# Patient Record
Sex: Male | Born: 1980 | Race: White | Hispanic: No | Marital: Single | Smoking: Current every day smoker
Health system: Southern US, Community
[De-identification: ages and names within clinical notes are randomized; demographics above are authoritative.]

## PROBLEM LIST (undated history)

## (undated) DIAGNOSIS — F329 Major depressive disorder, single episode, unspecified: Secondary | ICD-10-CM

## (undated) DIAGNOSIS — F32A Depression, unspecified: Secondary | ICD-10-CM

## (undated) DIAGNOSIS — T7840XA Allergy, unspecified, initial encounter: Secondary | ICD-10-CM

## (undated) HISTORY — DX: Allergy, unspecified, initial encounter: T78.40XA

## (undated) HISTORY — PX: SPINE SURGERY: SHX786

---

## 2013-07-15 ENCOUNTER — Ambulatory Visit: Payer: 59

## 2013-07-15 ENCOUNTER — Ambulatory Visit: Payer: 59 | Admitting: Emergency Medicine

## 2013-07-15 VITALS — BP 116/78 | HR 68 | Temp 98.1°F | Resp 14 | Ht 69.0 in | Wt 232.8 lb

## 2013-07-15 DIAGNOSIS — S4991XA Unspecified injury of right shoulder and upper arm, initial encounter: Secondary | ICD-10-CM

## 2013-07-15 DIAGNOSIS — S4980XA Other specified injuries of shoulder and upper arm, unspecified arm, initial encounter: Secondary | ICD-10-CM

## 2013-07-15 DIAGNOSIS — S43109A Unspecified dislocation of unspecified acromioclavicular joint, initial encounter: Secondary | ICD-10-CM

## 2013-07-15 DIAGNOSIS — S46909A Unspecified injury of unspecified muscle, fascia and tendon at shoulder and upper arm level, unspecified arm, initial encounter: Secondary | ICD-10-CM

## 2013-07-15 DIAGNOSIS — M25511 Pain in right shoulder: Secondary | ICD-10-CM

## 2013-07-15 MED ORDER — HYDROCODONE-ACETAMINOPHEN 10-325 MG PO TABS: 1.0000 | ORAL_TABLET | Freq: Two times a day (BID) | ORAL | Status: DC | PRN

## 2013-07-15 MED ORDER — MELOXICAM 15 MG PO TABS: 15.0000 mg | ORAL_TABLET | Freq: Every day | ORAL | Status: DC

## 2013-07-15 NOTE — Progress Notes (Signed)
   Subjective:    Patient ID: Fernando Luna, male    DOB: 01/03/1981, 33 y.o.   MRN: [id number]  HPI Patient presents today with pain in right shoulder. He fell on it yesterday while playing softball in [place]. Patient is right handed. Painful with impact. Has taken ibuprofen 800 mg alternating with acetaminophen 1000 mg. Little relief, unable to sleep due to pain. Also has abrasions on right knee and thigh and left flank area from sliding on field. He is not sure if he hit his head, his hat was dirty after he fell, but his head did not hurt, there was no loss of consciousness.  PMH- seasonal allergies  PSH- herniated disk surgery 2011- this [facility]. Has numbness when sitting for long times. Does not require medication for pain.   Review of Systems No headache, no radiation of pain, no weakness in hand, no numbness or tingling.    Objective:   Physical Exam  Vitals reviewed. Constitutional: He is oriented to person, place, and time. He appears well-developed and well-nourished. No distress.  HENT:  Head: Normocephalic and atraumatic.  Eyes: Conjunctivae are normal. Right eye exhibits no discharge. Left eye exhibits no discharge.  Neck: Normal range of motion. Neck supple.  Cardiovascular: Normal rate, regular rhythm and normal heart sounds.   Pulmonary/Chest: Effort normal and breath sounds normal.  Musculoskeletal: He exhibits tenderness. He exhibits no edema.       Right shoulder: He exhibits decreased range of motion, tenderness and pain.  Right hand, wrist, elbow with normal range of motion. Strong radial pulse, brisk capillary refill. Unable to raise right arm more than 90 degrees. Painful over anterior shoulder and lateral clavicle. No abnormalities palpated.   Neurological: He is alert and oriented to person, place, and time.  Skin: Skin is warm and dry. He is not diaphoretic.  Left knee, shin, right lateral side with abrasions.  Psychiatric: He has a normal mood  and affect. His behavior is normal. Judgment and thought content normal.   Right shoulder Xray and Right clavicle Xray- [facility] reading [facility]PRIMARY[facility] AC joint separation grade 1-2.     Assessment & Plan:  1. Acromioclavicular joint separation - Ambulatory referral to Orthopedic Surgery -Right shoulder sling applied, patient instructions on AC separation provided  2. Right shoulder injury - DG Clavicle Right; Future - DG Shoulder Right; Future - meloxicam (MOBIC) 15 MG tablet; Take 1 tablet (15 mg total) by mouth daily.  Dispense: 30 tablet; Refill: 0  3. Right shoulder pain - meloxicam (MOBIC) 15 MG tablet; Take 1 tablet (15 mg total) by mouth daily.  Dispense: 30 tablet; Refill: 0 - HYDROcodone-acetaminophen (NORCO) 10-325 MG per tablet; Take 1 tablet by mouth every 12 (twelve) hours as needed.  Dispense: 20 tablet; Refill: 0  Discussed with Dr. Cleta Albertsaub who read the xray and examined the patient.  Emi Belfasteborah B. Gessner, FNP-BC  [facility]  07/15/2013 11:19 AM

## 2013-07-15 NOTE — Patient Instructions (Signed)
Shoulder Separation You have a shoulder separation (acromio-clavicular separation). This occurs when an injury stretches or tears the ligaments that connect the top of the shoulder blade to the collar bone. Injury causes these bones to separate. A sling or splint may be applied to limit your range of motion. HOME CARE INSTRUCTIONS   Apply ice to the injury for 15-20 minutes each hour while awake for the first 2 days. Put the ice in a plastic bag. Place a towel between the bag of ice and your skin.  Avoid activities that increase the pain.  Wear your sling or splint for as long as directed by your caregiver. If you remove the sling to dress or bathe, be sure your arm remains in the same position as when the sling is on. Do not lift your arm.  The splint must be tightened by another person every day. Tighten it enough to keep the shoulders held back. Allow enough room to place the index finger between the body and strap. Loosen the splint immediately if you feel numbness or tingling in your hands.  Only take over-the-counter or prescription medicines for pain, discomfort, or fever as directed by your caregiver.  If this is a severe injury, you may need a referral to a specialist. It is very important to keep any follow-up appointments in order to avoid any type of permanent shoulder disability or chronic pain problems. SEEK MEDICAL CARE IF:  Pain or swelling to the injury increases and is not relieved with medications. SEEK IMMEDIATE MEDICAL CARE IF:  Your arm becomes numb, pale, cold, or there are abnormal feelings (sensations) shooting into your fingertips. Document Released: 12/15/2004 Document Revised: 05/30/2011 Document Reviewed: 10/17/2006 ExitCare Patient Information 2014 [employer].  

## 2013-07-16 ENCOUNTER — Telehealth: Payer: Self-pay

## 2013-07-16 NOTE — Telephone Encounter (Signed)
Patient stated that he is taking hydrocodone with food, however, it makes him feel like he is going to pass out when he takes it.  He states that he has taken the medication before with the same side effect.  Would like to have a different medicine sent to pharmacy if possible.  Please advise.

## 2013-07-16 NOTE — Telephone Encounter (Signed)
He needs to stop the hydrocodone. He can only take the ibuprofen and Tylenol together no other narcotics

## 2013-07-16 NOTE — Telephone Encounter (Signed)
Spoke with patient.  Advised him to stop hydrocodone all together.  Take only ibuprofen and tylenol together.  Patient verbalized understanding.

## 2013-07-16 NOTE — Telephone Encounter (Signed)
PT STATES THE MEDICINE HE WAS GIVEN YESTERDAY IS MAKING HIM SICK, WOULD LIKE A CALL BACK AT [phone]

## 2015-03-03 ENCOUNTER — Emergency Department (HOSPITAL_COMMUNITY)
Admission: EM | Admit: 2015-03-03 | Discharge: 2015-03-03 | Disposition: A | Discharge status: Other Acute Inpatient Hospital | Payer: 59 | Attending: Emergency Medicine | Admitting: Emergency Medicine

## 2015-03-03 ENCOUNTER — Encounter (HOSPITAL_COMMUNITY): Payer: Self-pay | Admitting: Nurse Practitioner

## 2015-03-03 DIAGNOSIS — F17210 Nicotine dependence, cigarettes, uncomplicated: Secondary | ICD-10-CM | POA: Insufficient documentation

## 2015-03-03 DIAGNOSIS — Z79899 Other long term (current) drug therapy: Secondary | ICD-10-CM | POA: Diagnosis not present

## 2015-03-03 DIAGNOSIS — F329 Major depressive disorder, single episode, unspecified: Secondary | ICD-10-CM | POA: Insufficient documentation

## 2015-03-03 DIAGNOSIS — R45851 Suicidal ideations: Secondary | ICD-10-CM | POA: Diagnosis present

## 2015-03-03 DIAGNOSIS — F419 Anxiety disorder, unspecified: Secondary | ICD-10-CM | POA: Diagnosis not present

## 2015-03-03 DIAGNOSIS — Z791 Long term (current) use of non-steroidal anti-inflammatories (NSAID): Secondary | ICD-10-CM | POA: Insufficient documentation

## 2015-03-03 HISTORY — DX: Depression, unspecified: F32.A

## 2015-03-03 HISTORY — DX: Major depressive disorder, single episode, unspecified: F32.9

## 2015-03-03 LAB — COMPREHENSIVE METABOLIC PANEL
ALBUMIN: 4.3 g/dL (ref 3.5–5.0)
ALT: 80 U/L — ABNORMAL HIGH (ref 17–63)
AST: 41 U/L (ref 15–41)
Alkaline Phosphatase: 59 U/L (ref 38–126)
Anion gap: 9 (ref 5–15)
BUN: 7 mg/dL (ref 6–20)
CHLORIDE: 105 mmol/L (ref 101–111)
CO2: 26 mmol/L (ref 22–32)
Calcium: 9.5 mg/dL (ref 8.9–10.3)
Creatinine, Ser: 1.18 mg/dL (ref 0.61–1.24)
GFR calc Af Amer: >60 mL/min (ref >60)
Glucose, Bld: 89 mg/dL (ref 65–99)
POTASSIUM: 3.7 mmol/L (ref 3.5–5.1)
SODIUM: 140 mmol/L (ref 135–145)
Total Bilirubin: 0.8 mg/dL (ref 0.3–1.2)
Total Protein: 8.2 g/dL — ABNORMAL HIGH (ref 6.5–8.1)

## 2015-03-03 LAB — CBC
HCT: 48.8 % (ref 39.0–52.0)
HEMOGLOBIN: 16.7 g/dL (ref 13.0–17.0)
MCH: 31 pg (ref 26.0–34.0)
MCHC: 34.2 g/dL (ref 30.0–36.0)
MCV: 90.7 fL (ref 78.0–100.0)
PLATELETS: 260 10*3/uL (ref 150–400)
RBC: 5.38 MIL/uL (ref 4.22–5.81)
RDW: 12.5 % (ref 11.5–15.5)
WBC: 7 10*3/uL (ref 4.0–10.5)

## 2015-03-03 LAB — ETHANOL

## 2015-03-03 LAB — RAPID URINE DRUG SCREEN, HOSP PERFORMED
AMPHETAMINES: NOT DETECTED
BENZODIAZEPINES: NOT DETECTED
Barbiturates: NOT DETECTED
Cocaine: NOT DETECTED
OPIATES: NOT DETECTED
TETRAHYDROCANNABINOL: NOT DETECTED

## 2015-03-03 LAB — SALICYLATE LEVEL: Salicylate Lvl: <4 mg/dL (ref 2.8–30.0)

## 2015-03-03 LAB — ACETAMINOPHEN LEVEL: Acetaminophen (Tylenol), Serum: <10 ug/mL — ABNORMAL LOW (ref 10–30)

## 2015-03-03 MED ORDER — NICOTINE 21 MG/24HR TD PT24
21.0000 mg | MEDICATED_PATCH | Freq: Every day | TRANSDERMAL | Status: DC
  Administered 2015-03-03: 21 mg via TRANSDERMAL
  Filled 2015-03-03: qty 1

## 2015-03-03 MED ORDER — LORAZEPAM 1 MG PO TABS: 1.0000 mg | ORAL_TABLET | Freq: Three times a day (TID) | ORAL | Status: DC | PRN

## 2015-03-03 MED ORDER — ACETAMINOPHEN 325 MG PO TABS: 650.0000 mg | ORAL_TABLET | ORAL | Status: DC | PRN

## 2015-03-03 MED ORDER — ZOLPIDEM TARTRATE 5 MG PO TABS
5.0000 mg | ORAL_TABLET | Freq: Every evening | ORAL | Status: DC | PRN
Start: 2015-03-03 — End: 2015-03-04

## 2015-03-03 MED ORDER — IBUPROFEN 400 MG PO TABS: 600.0000 mg | ORAL_TABLET | Freq: Three times a day (TID) | ORAL | Status: DC | PRN

## 2015-03-03 MED ORDER — ALUM & MAG HYDROXIDE-SIMETH 200-200-20 MG/5ML PO SUSP: 30.0000 mL | ORAL | Status: DC | PRN

## 2015-03-03 MED ORDER — ONDANSETRON HCL 4 MG PO TABS: 4.0000 mg | ORAL_TABLET | Freq: Three times a day (TID) | ORAL | Status: DC | PRN

## 2015-03-03 NOTE — ED Provider Notes (Signed)
CSN: [id number]     Arrival date & time 03/03/15  1429 History   First MD Initiated Contact with Patient 03/03/15 1535     Chief Complaint  Patient presents with  . Suicidal    HPI This patient with history of depression presents with ongoing despondency, suicidal thoughts. Patient has had episodes periodically for at least one year or Over the past day he has had increasing perseverates about suicide. He can describe a desire for painless death, fulfilling his desire to not be here anymore. He denies explicit homicidal ideation. He denies physical pain or complaints. He was previously on Zoloft, is not taking this medication, as he feels it does not work.  Past Medical History  Diagnosis Date  . Allergy   . Depression    Past Surgical History  Procedure Laterality Date  . Spine surgery      herniated disc in back L4,L5   Family History  Problem Relation Age of Onset  . Diabetes Paternal Grandfather   . Heart disease Paternal Grandfather    Social History  Substance Use Topics  . Smoking status: Current Every Day Smoker -- 0.70 packs/day    Types: Cigarettes  . Smokeless tobacco: None  . Alcohol Use: 1.0 oz/week    2 Standard drinks or equivalent per week    Review of Systems  Constitutional:       Per HPI, otherwise negative  HENT:       Per HPI, otherwise negative  Respiratory:       Per HPI, otherwise negative  Cardiovascular:       Per HPI, otherwise negative  Gastrointestinal: Negative for vomiting.  Endocrine:       Negative aside from HPI  Genitourinary:       Neg aside from HPI   Musculoskeletal:       Per HPI, otherwise negative  Skin:       Self-inked tattoo on L wrist  Neurological: Negative for syncope.  Psychiatric/Behavioral: Positive for suicidal ideas and dysphoric mood. Negative for self-injury. The patient is nervous/anxious.       Allergies  Codeine  Home Medications   Prior to Admission medications   Medication Sig Start Date  End Date Taking? Authorizing Provider  cetirizine (ZYRTEC) 10 MG tablet Take 10 mg by mouth daily.    Historical Provider, MD  HYDROcodone-acetaminophen (NORCO) 10-325 MG per tablet Take 1 tablet by mouth every 12 (twelve) hours as needed. 07/15/13   Emi Belfasteborah B Gessner, FNP  meloxicam (MOBIC) 15 MG tablet Take 1 tablet (15 mg total) by mouth daily. 07/15/13   Emi Belfasteborah B Gessner, FNP   BP 118/87 mmHg  Pulse 83  Temp(Src) 98.2 F (36.8 C) (Oral)  Resp 17  Ht 5\' 10"  (1.778 m)  Wt 220 lb 4.8 oz (99.927 kg)  BMI 31.61 kg/m2  SpO2 95% Physical Exam  Constitutional: He is oriented to person, place, and time. He appears well-developed. No distress.  HENT:  Head: Normocephalic and atraumatic.  Eyes: Conjunctivae and EOM are normal.  Cardiovascular: Normal rate and regular rhythm.   Pulmonary/Chest: Effort normal. No stridor. No respiratory distress.  Abdominal: He exhibits no distension.  Musculoskeletal: He exhibits no edema.  Neurological: He is alert and oriented to person, place, and time.  Skin: Skin is warm and dry.  Psychiatric: He exhibits a depressed mood. He expresses suicidal ideation. He expresses no homicidal ideation. He expresses suicidal plans.  Nursing note and vitals reviewed.   ED Course  Procedures (  including critical care time) Labs Review Labs Reviewed  COMPREHENSIVE METABOLIC PANEL - Abnormal; Notable for the following:    Total Protein 8.2 (*)    ALT 80 (*)    All other components within normal limits  ACETAMINOPHEN LEVEL - Abnormal; Notable for the following:    Acetaminophen (Tylenol), Serum <10 (*)    All other components within normal limits  ETHANOL  SALICYLATE LEVEL  CBC  URINE RAPID DRUG SCREEN, HOSP PERFORMED      MDM  Comfortable appearing young male presents with ongoing despondency, suicidal ideation. Patient has unremarkable slight lab abnormalities, but no substantial concerns. Patient is hemodynamically stable, medically cleared for  psychiatric evaluation.  Gerhard Munch, MD 03/03/15 [phone]

## 2015-03-03 NOTE — ED Notes (Signed)
Tech sitting with pt. 

## 2015-03-03 NOTE — BH Assessment (Signed)
Per Vernona RiegerLaura, patient meets criteria for OBS.  [facility].  AC will contact the nurse when the bed becomes available.  Writer informed the nurse Kriste Basque(Becky)

## 2015-03-03 NOTE — ED Notes (Signed)
Pt was wanded by Security 

## 2015-03-03 NOTE — ED Notes (Signed)
He reports hx depression and suicidal thoughts, was treated for some time with zoloft but he felt it didn't work so he stopped taking it. Over past days hes felt more depressed and having suicidal thoughts again. He also feels like harming people such as "punching someon in the face" but does not have feelings of killing someone else. He denies a specific plan for suicide. He reports occasional alcohol use and denies any other substance use. He A&ox4, resp e/u

## 2015-03-03 NOTE — BH Assessment (Addendum)
Tele Assessment Note   Patient is a 34 year old white male that reports passive SI without a plan.  Patient reports his boyfriend recently broke up with him and stated that he no longer wanted any contact with him.    Patient reports increased depression associated with this break up.  Patient reports that, "he no longer wants to be in the works".  Patient denies having a plan to harm himself.  Patient reports a prior suicide attempt in 2008 when he took sleeping pills in an attempt to kill himself.  Patient reports that he was [place] on [place] [facility] [place], but he felt it didn't work so he stopped taking it. Over past days he's felt more depressed and having suicidal thoughts again.   Patient denies H/Psychosis/Substance Abuse.  Patient denies prior psychiatric hospitalization.  Patient denies physical, sexual or emotional abuse.   Patient reports that he lives with his parents and he works at a call center.  Patient reports that he has been working at this call center for over two years.  Patient reports that he was in [employer] for four and a half years.    Diagnosis: Major Depressive Disorder   Past Medical History:  Past Medical History  Diagnosis Date  . Allergy   . Depression     Past Surgical History  Procedure Laterality Date  . Spine surgery      herniated disc in back L4,L5    Family History:  Family History  Problem Relation Age of Onset  . Diabetes Paternal Grandfather   . Heart disease Paternal Grandfather     Social History:  reports that he has been smoking Cigarettes.  He has been smoking about 0.70 packs per day. He does not have any smokeless tobacco history on file. He reports that he drinks about 1.0 oz of alcohol per week. He reports that he does not use illicit drugs.  Additional Social History:     CIWA: CIWA-Ar BP: 118/87 mmHg Pulse Rate: 83 COWS:    PATIENT STRENGTHS: (choose at least two) Communication skills Physical  Health Work skills  Allergies:  Allergies  Allergen Reactions  . Codeine Nausea And Vomiting    Home Medications:  (Not in a hospital admission)  OB/GYN Status:  No LMP for male patient.  General Assessment Data [facility] Assessment: In system Is this a Tele or Face-to-Face Assessment?: Tele Assessment Is this an Initial Assessment or a Re-assessment for this encounter?: Initial Assessment Marital status: Single Maiden name: NA Is patient pregnant?: No Pregnancy Status: No Living Arrangements: Parent Can pt return to current living arrangement?: Yes Admission Status: Voluntary Is patient capable of signing voluntary admission?: Yes Referral Source: Self/Family/Friend Insurance type: uhc  Medical Screening Exam [facility]) Medical Exam completed: Yes  Crisis Care Plan Living Arrangements: Parent Legal Guardian:  (na) Name of Psychiatrist: NA Name of Therapist: NA  Education Status Is patient currently in school?: No Current Grade: NA Highest grade of school patient has completed: NA Name of school: NA Contact person: NA  Risk to self with the past 6 months Suicidal Ideation: Yes-Currently Present Has patient been a risk to self within the past 6 months prior to admission? : Yes Suicidal Intent: Yes-Currently Present Has patient had any suicidal intent within the past 6 months prior to admission? : Yes Is patient at risk for suicide?: Yes Suicidal Plan?: No Has patient had any suicidal plan within  the past 6 months prior to admission? : No Specify Current Suicidal Plan: NA Access to Means: No What has been your use of drugs/alcohol within the last 12 months?: NA Previous Attempts/Gestures: Yes How many times?: 1 Other Self Harm Risks: NA Triggers for Past Attempts: Other (Comment) (Break up with his boyfriend) Intentional Self Injurious Behavior: None Family Suicide History: No Recent stressful life event(s): Other (Comment)  (Breakup with boyfriend) Persecutory voices/beliefs?: No Depression: No Depression Symptoms: Despondent, Insomnia, Tearfulness, Isolating, Fatigue, Guilt, Feeling worthless/self pity, Loss of interest in usual pleasures Substance abuse history and/or treatment for substance abuse?: No Suicide prevention information given to non-admitted patients: Yes  Risk to Others within the past 6 months Homicidal Ideation: No Does patient have any lifetime risk of violence toward others beyond the six months prior to admission? : No Thoughts of Harm to Others: No Current Homicidal Intent: No Current Homicidal Plan: No Access to Homicidal Means: No Identified Victim: NA History of harm to others?: No Assessment of Violence: None Noted Violent Behavior Description: NA Does patient have access to weapons?: No Criminal Charges Pending?: No Does patient have a court date: No Is patient on probation?: No  Psychosis Hallucinations: None noted Delusions: None noted  Mental Status Report Appearance/Hygiene: Disheveled Eye Contact: Good Motor Activity: Freedom of movement, Unremarkable Speech: Logical/coherent Level of Consciousness: Alert Mood: Depressed Affect: Blunted, Depressed Anxiety Level: Minimal Thought Processes: Relevant, Coherent Judgement: Unimpaired Orientation: Person, Place, Time, Situation Obsessive Compulsive Thoughts/Behaviors: None  Cognitive Functioning Concentration: Decreased Memory: Recent Intact, Remote Intact IQ: Average Insight: Fair Impulse Control: Fair Appetite: Fair Weight Loss: 0 Weight Gain: 0 Sleep: Decreased Total Hours of Sleep: 6 Vegetative Symptoms: None  ADLScreening [facility] Assessment Services) Patient's cognitive ability adequate to safely complete daily activities?: Yes Patient able to express need for assistance with ADLs?: Yes Independently performs ADLs?: Yes (appropriate for developmental age)  Prior Inpatient Therapy Prior Inpatient  Therapy: No Prior Therapy Dates: NA Prior Therapy Facilty/Provider(s): NA Reason for Treatment: NA  Prior Outpatient Therapy Prior Outpatient Therapy: Yes Prior Therapy Dates: 2014 [facility] [place]  Reason for Treatment: Med Mgt and Therapy  Does patient have an ACCT team?: No Does patient have Intensive In-House Services?  : No Does patient have [facility] services? : No Does patient have P4CC services?: No  ADL Screening (condition at time of admission) Patient's cognitive ability adequate to safely complete daily activities?: Yes Patient able to express need for assistance with ADLs?: Yes Independently performs ADLs?: Yes (appropriate for developmental age)                  Additional Information 1:1 In Past 12 Months?: No CIRT Risk: No Elopement Risk: No Does patient have medical clearance?: Yes     Disposition: Per Vernona Rieger, patient meets criteria for OBS.  Per [facility]a Areola) accepted to OBS.  AC will contact the nurse when the bed becomes available.  Writer informed the nurse Kriste Basque).   Disposition Initial Assessment Completed for this Encounter: Yes  Linton Rump 03/03/2015 4:31 PM

## 2015-03-03 NOTE — ED Notes (Signed)
Pt ambulatory to Pod C-24 - wearing maroon-colored paper scrubs. Belongings in 2 labeled belongings bags placed at nurses' desk by RN.

## 2015-03-03 NOTE — ED Notes (Addendum)
Discussed tx plan w/pt and voiced understanding of Pod C policies. Pt's water bottle removed from room. Pt [facility]. Pt asked if may smoke a cigarette - advised no d/t no smoking facility. Offered pt Nicotine Patch - declines at this time.

## 2015-03-03 NOTE — ED Notes (Signed)
Per Augusto GambleJody, EMT, pt requesting Nicotine Patch.

## 2015-03-03 NOTE — ED Notes (Signed)
Per Ava, [facility].

## 2015-03-03 NOTE — ED Notes (Signed)
Staffing Office aware pt moved to C-24.

## 2015-03-03 NOTE — BH Assessment (Signed)
TTS [name] will assess the patient.  

## 2015-03-04 ENCOUNTER — Observation Stay (HOSPITAL_COMMUNITY)
Admission: AD | Admit: 2015-03-04 | Discharge: 2015-03-04 | Disposition: A | Discharge status: Home / Self Care | Payer: 59 | Source: Intra-hospital | Attending: Psychiatry | Admitting: Psychiatry

## 2015-03-04 ENCOUNTER — Encounter (HOSPITAL_COMMUNITY): Payer: Self-pay

## 2015-03-04 DIAGNOSIS — F332 Major depressive disorder, recurrent severe without psychotic features: Principal | ICD-10-CM | POA: Insufficient documentation

## 2015-03-04 DIAGNOSIS — F4322 Adjustment disorder with anxiety: Secondary | ICD-10-CM | POA: Diagnosis not present

## 2015-03-04 DIAGNOSIS — R45851 Suicidal ideations: Secondary | ICD-10-CM

## 2015-03-04 DIAGNOSIS — Z885 Allergy status to narcotic agent status: Secondary | ICD-10-CM | POA: Insufficient documentation

## 2015-03-04 DIAGNOSIS — F17210 Nicotine dependence, cigarettes, uncomplicated: Secondary | ICD-10-CM | POA: Insufficient documentation

## 2015-03-04 MED ORDER — TRAZODONE HCL 50 MG PO TABS: 50.0000 mg | ORAL_TABLET | Freq: Every evening | ORAL | Status: AC | PRN

## 2015-03-04 MED ORDER — LORATADINE 10 MG PO TABS
10.0000 mg | ORAL_TABLET | Freq: Every day | ORAL | Status: DC
  Administered 2015-03-04: 10 mg via ORAL
  Filled 2015-03-04: qty 1

## 2015-03-04 MED ORDER — ACETAMINOPHEN 325 MG PO TABS: 650.0000 mg | ORAL_TABLET | Freq: Four times a day (QID) | ORAL | Status: DC | PRN

## 2015-03-04 MED ORDER — FLUOXETINE HCL 20 MG PO CAPS
20.0000 mg | ORAL_CAPSULE | Freq: Every day | ORAL | Status: DC
  Administered 2015-03-04: 20 mg via ORAL
  Filled 2015-03-04: qty 1

## 2015-03-04 MED ORDER — ALUM & MAG HYDROXIDE-SIMETH 200-200-20 MG/5ML PO SUSP: 30.0000 mL | ORAL | Status: DC | PRN

## 2015-03-04 MED ORDER — MAGNESIUM HYDROXIDE 400 MG/5ML PO SUSP: 30.0000 mL | Freq: Every day | ORAL | Status: DC | PRN

## 2015-03-04 MED ORDER — HYDROXYZINE HCL 25 MG PO TABS: 25.0000 mg | ORAL_TABLET | Freq: Four times a day (QID) | ORAL | Status: DC | PRN

## 2015-03-04 MED ORDER — FLUOXETINE HCL 20 MG PO CAPS: 20.0000 mg | ORAL_CAPSULE | Freq: Every day | ORAL | Status: AC

## 2015-03-04 MED ORDER — TRAZODONE HCL 50 MG PO TABS
50.0000 mg | ORAL_TABLET | Freq: Every evening | ORAL | Status: DC | PRN
  Administered 2015-03-04: 50 mg via ORAL

## 2015-03-04 NOTE — Discharge Instructions (Signed)
Patient will be discharged this date and will follow up with medication management at the "[facility]," at [address], [place] [phone]. Patient will also follow up for therapy at "[facility]," at [address] [place], [phone]. Patient was also given a list of out patient referral resources to also explore inn the area that will provide additional support.

## 2015-03-04 NOTE — H&P (Signed)
Psychiatric Admission Assessment Adult  Patient Identification: Fernando Luna MRN:  [id number] Date of Evaluation:  03/04/2015 Chief Complaint:  Depressive symptoms with passive SI Principal Diagnosis: MDD recurrent severe with passive SI, Adjustment disorder with anxiety  Patient Active Problem List   Diagnosis Date Noted  . MDD (major depressive disorder), recurrent episode, severe ([place]) [F33.2] 03/04/2015   History of Present Illness: Fernando Luna parents to the [facility] Obs Unit via transfer from the [facility] for crises intervention,sfety and stabilization. Patient has a hx of MDD has received outpatient psychotherapy without medical management in the recent past. He has been endorsing over the past few weeks, worse ing depressive symptoms to include feelings of helplessness, hopelessness, worhtlessness, and sadness due to a recent break up of a male partner. He is endorsing some passive SI ( I wouldn't mind if I died of natural causes or an accident) but is denying any specific plan, SA or HI. He is also endorsing insomnia, racing thoughts, ruminating and anxiety symptoms. He is denying any AVH, paranoia or delusional thoughts. Associated Signs/Symptoms: Depression Symptoms:  depressed mood, anhedonia, feelings of worthlessness/guilt, (Hypo) Manic Symptoms:  denies Anxiety Symptoms:  Worrying,racing thoughts, ruminating Psychotic Symptoms:  denies PTSD Symptoms: Negative Total Time spent with patient: 30 minutes  Past Psychiatric History: MDD  Risk to Self:   Risk to Others:   Prior Inpatient Therapy:   Prior Outpatient Therapy:    Alcohol Screening:   Substance Abuse History in the last 12 months:  No. Consequences of Substance Abuse: NA Previous Psychotropic Medications: Yes  Psychological Evaluations: Yes  Past Medical History:  Past Medical History  Diagnosis Date  . Allergy   . Depression     Past Surgical History  Procedure Laterality Date  . Spine surgery       herniated disc in back L4,L5   Family History:  Family History  Problem Relation Age of Onset  . Diabetes Paternal Grandfather   . Heart disease Paternal Grandfather    Family Psychiatric  History:unremarkable Social History:  History  Alcohol Use  . 1.0 oz/week  . 2 Standard drinks or equivalent per week     History  Drug Use No    Social History   Social History  . Marital Status: Single    Spouse Name: N/A  . Number of Children: N/A  . Years of Education: N/A   Social History Main Topics  . Smoking status: Current Every Day Smoker -- 0.70 packs/day    Types: Cigarettes  . Smokeless tobacco: Not on file  . Alcohol Use: 1.0 oz/week    2 Standard drinks or equivalent per week  . Drug Use: No  . Sexual Activity: Not on file   Other Topics Concern  . Not on file   Social History Narrative   Additional Social History:                         Allergies:   Allergies  Allergen Reactions  . Codeine Nausea And Vomiting   Lab Results:  Results for orders placed or performed during the hospital encounter of 03/03/15 (from the past 48 hour(s))  Comprehensive metabolic panel     Status: Abnormal   Collection Time: 03/03/15  3:29 PM  Result Value Ref Range   Sodium 140 135 - 145 mmol/L   Potassium 3.7 3.5 - 5.1 mmol/L   Chloride 105 101 - 111 mmol/L   CO2 26 22 - 32 mmol/L  Glucose, Bld 89 65 - 99 mg/dL   BUN 7 6 - 20 mg/dL   Creatinine, Ser 1.18 0.61 - 1.24 mg/dL   Calcium 9.5 8.9 - 10.3 mg/dL   Total Protein 8.2 (H) 6.5 - 8.1 g/dL   Albumin 4.3 3.5 - 5.0 g/dL   AST 41 15 - 41 U/L   ALT 80 (H) 17 - 63 U/L   Alkaline Phosphatase 59 38 - 126 U/L   Total Bilirubin 0.8 0.3 - 1.2 mg/dL   GFR calc non Af Amer >60 >60 mL/min   GFR calc Af Amer >60 >60 mL/min    Comment: (NOTE) The eGFR has been calculated using the CKD EPI equation. This calculation has not been validated in all clinical situations. eGFR's persistently <60 mL/min signify possible  Chronic Kidney Disease.    Anion gap 9 5 - 15  Ethanol (ETOH)     Status: None   Collection Time: 03/03/15  3:29 PM  Result Value Ref Range   Alcohol, Ethyl (B) <5 <5 mg/dL    Comment:        LOWEST DETECTABLE LIMIT FOR SERUM ALCOHOL IS 5 mg/dL FOR MEDICAL PURPOSES ONLY   Salicylate level     Status: None   Collection Time: 03/03/15  3:29 PM  Result Value Ref Range   Salicylate Lvl <9.3 2.8 - 30.0 mg/dL  Acetaminophen level     Status: Abnormal   Collection Time: 03/03/15  3:29 PM  Result Value Ref Range   Acetaminophen (Tylenol), Serum <10 (L) 10 - 30 ug/mL    Comment:        THERAPEUTIC CONCENTRATIONS VARY SIGNIFICANTLY. A RANGE OF 10-30 ug/mL MAY BE AN EFFECTIVE CONCENTRATION FOR MANY PATIENTS. HOWEVER, SOME ARE BEST TREATED AT CONCENTRATIONS OUTSIDE THIS RANGE. ACETAMINOPHEN CONCENTRATIONS >150 ug/mL AT 4 HOURS AFTER INGESTION AND >50 ug/mL AT 12 HOURS AFTER INGESTION ARE OFTEN ASSOCIATED WITH TOXIC REACTIONS.   CBC     Status: None   Collection Time: 03/03/15  3:29 PM  Result Value Ref Range   WBC 7.0 4.0 - 10.5 K/uL   RBC 5.38 4.22 - 5.81 MIL/uL   Hemoglobin 16.7 13.0 - 17.0 g/dL   HCT 48.8 39.0 - 52.0 %   MCV 90.7 78.0 - 100.0 fL   MCH 31.0 26.0 - 34.0 pg   MCHC 34.2 30.0 - 36.0 g/dL   RDW 12.5 11.5 - 15.5 %   Platelets 260 150 - 400 K/uL  Urine rapid drug screen (hosp performed) (Not at [facility])     Status: None   Collection Time: 03/03/15  3:37 PM  Result Value Ref Range   Opiates NONE DETECTED NONE DETECTED   Cocaine NONE DETECTED NONE DETECTED   Benzodiazepines NONE DETECTED NONE DETECTED   Amphetamines NONE DETECTED NONE DETECTED   Tetrahydrocannabinol NONE DETECTED NONE DETECTED   Barbiturates NONE DETECTED NONE DETECTED    Comment:        DRUG SCREEN FOR MEDICAL PURPOSES ONLY.  IF CONFIRMATION IS NEEDED FOR ANY PURPOSE, NOTIFY LAB WITHIN 5 DAYS.        LOWEST DETECTABLE LIMITS FOR URINE DRUG SCREEN Drug Class       Cutoff  (ng/mL) Amphetamine      1000 Barbiturate      200 Benzodiazepine   734 Tricyclics       287 Opiates          300 Cocaine          300 THC  50     Metabolic Disorder Labs:  No results found for: HGBA1C, MPG No results found for: PROLACTIN No results found for: CHOL, TRIG, HDL, CHOLHDL, VLDL, LDLCALC  Current Medications: Current Facility-Administered Medications  Medication Dose Route Frequency Provider Last Rate Last Dose  . acetaminophen (TYLENOL) tablet 650 mg  650 mg Oral Q6H PRN Laverle Hobby, PA-C      . alum & mag hydroxide-simeth (MAALOX/MYLANTA) 200-200-20 MG/5ML suspension 30 mL  30 mL Oral Q4H PRN Laverle Hobby, PA-C      . FLUoxetine (PROZAC) capsule 20 mg  20 mg Oral Daily Laverle Hobby, PA-C      . hydrOXYzine (ATARAX/VISTARIL) tablet 25 mg  25 mg Oral Q6H PRN Laverle Hobby, PA-C      . loratadine (CLARITIN) tablet 10 mg  10 mg Oral Daily Laverle Hobby, PA-C      . magnesium hydroxide (MILK OF MAGNESIA) suspension 30 mL  30 mL Oral Daily PRN Laverle Hobby, PA-C      . traZODone (DESYREL) tablet 50 mg  50 mg Oral QHS PRN Laverle Hobby, PA-C       PTA Medications: Prescriptions prior to admission  Medication Sig Dispense Refill Last Dose  . cetirizine (ZYRTEC) 10 MG tablet Take 10 mg by mouth daily as needed for allergies.    over 30 days    Musculoskeletal: Strength & Muscle Tone: within normal limits Gait & Station: normal Patient leans: N/A  Psychiatric Specialty Exam: Physical Exam  Constitutional: He is oriented to person, place, and time. He appears well-developed and well-nourished. No distress.  HENT:  Head: Normocephalic.  Respiratory: Effort normal.  Neurological: He is alert and oriented to person, place, and time. No cranial nerve deficit.  Skin: Skin is dry. He is not diaphoretic.    Review of Systems  Psychiatric/Behavioral: Positive for depression and suicidal ideas. Negative for hallucinations and substance abuse.  The patient is nervous/anxious and has insomnia.   All other systems reviewed and are negative.   There were no vitals taken for this visit.There is no weight on file to calculate BMI.  General Appearance: Casual  Eye Contact::  Good  Speech:  Clear and Coherent  Volume:  Normal  Mood:  Depressed  Affect:  Congruent  Thought Process:  Circumstantial  Orientation:  Full (Time, Place, and Person)  Thought Content:  Negative  Suicidal Thoughts:  Yes.  without intent/plan  Homicidal Thoughts:  No  Memory:  Immediate;   Good  Judgement:  Fair  Insight:  Fair  Psychomotor Activity:  Normal  Concentration:  Good  Recall:  Good  Fund of Knowledge:Good  Language: Good  Akathisia:  Negative  Handed:  Right  AIMS (if indicated):     Assets:  Desire for Improvement  ADL's:  Intact  Cognition: WNL  Sleep:        Treatment Plan Summary: Plan Admitted to [facility] Obs Unit for crises intervention,safety and stabilization. SSRI therapy initiated (Prozac) '20mg'$  daily, further palns for disposition per TTS staff in am  Observation Level/Precautions:  Continuous Observation  Laboratory:    Psychotherapy:    Medications:    Consultations:    Discharge Concerns:    Estimated LOS:24 - 48 hours  Other:     I certify that inpatient services furnished can reasonably be expected to improve the patient's condition.   SIMON,SPENCER E 12/14/20163:12 AM

## 2015-03-04 NOTE — Progress Notes (Signed)
Nursing Discharge Note:  Patient has received copy of Discharge Instructions and signed copy of same for chart. Patient also receiving prescriptions and states understanding of his followup and discharge instructions.Patient also receiving his locked belongings and signing that everything is present.  Patient denies any SI/HI/AVH, mood noticably more upbeat than previously in shift. Staff member escorting him to [place] to [employer] personnel for transport back to [facility] where patient has his automobile.

## 2015-03-04 NOTE — BH Assessment (Signed)
BHH Assessment Progress Note  Patient will be discharged this date and will follow up with medication management at the "[facility]," at [address], [place] [phone]. Patient will also follow up for therapy at [facility]," at [address] [place], [phone]. Patient was also given a list of out patient referral resources to also explore inn the area that will provide additional support. This Clinical research associatewriter also spoke at length with patient in reference to what therapeutic modalities may work best for him associated with his current needs. This Clinical research associatewriter also gave patient a list of resources and encouraged family counseling at a later date.

## 2015-03-04 NOTE — Progress Notes (Signed)
Pt is 34 yo male that presents with passive SI with no plan.  Pt communicated to parents this week that he was gay.  This was not well received by parents.  Pty also experienced break-up with significant other.  Pt reports increased depression as result of recent events.  Former [employer] enlisted.  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  Currently denies SI, HI, AV/H.  Verbal contract for safety.  [facility] Trazodone and given.

## 2015-03-04 NOTE — Progress Notes (Signed)
Nursing Shift Assessment:  Patient sleeping but easy to awaken. Patient cooperative and taking all medications as ordered. Patient mostly quiet, does admit to passive type SI, denies any plan, denies HI and AVH.  Nurse providing continuous observation for safety, emotional support and therapeutic communication.  Patient remains safe on unit and verbally contracts for safety in that he agrees to alert staff should he have any thoughts of harming himself.

## 2015-03-04 NOTE — Discharge Summary (Signed)
OBS Discharge Summary Note  Patient:  Fernando Luna is an 34 y.o., male MRN:  [id number] DOB:  May 16, 1980 Patient phone:  [phone] (home)  Patient address:   [address] [place] [address],  Total Time spent with patient: 30 minutes  Date of Admission:  03/04/2015 Date of Discharge: 03/04/2015  Reason for Admission:  depression  Principal Problem: MDD (major depressive disorder), recurrent episode, severe [facility]) Discharge Diagnoses: Patient Active Problem List   Diagnosis Date Noted  . MDD (major depressive disorder), recurrent episode, severe ([facility]) [F33.2] 03/04/2015    Priority: High    Past Psychiatric History: see above  Past Medical History:  Past Medical History  Diagnosis Date  . Allergy   . Depression     Past Surgical History  Procedure Laterality Date  . Spine surgery      herniated disc in back L4,L5   Family History:  Family History  Problem Relation Age of Onset  . Diabetes Paternal Grandfather   . Heart disease Paternal Grandfather    Family Psychiatric  History:  Denies Social History:  History  Alcohol Use  . 1.0 oz/week  . 2 Standard drinks or equivalent per week     History  Drug Use No    Social History   Social History  . Marital Status: Single    Spouse Name: N/A  . Number of Children: N/A  . Years of Education: N/A   Social History Main Topics  . Smoking status: Current Every Day Smoker -- 0.70 packs/day    Types: Cigarettes  . Smokeless tobacco: None  . Alcohol Use: 1.0 oz/week    2 Standard drinks or equivalent per week  . Drug Use: No  . Sexual Activity: Not Asked   Other Topics Concern  . None   Social History Narrative    Hospital Course:  Damani Kelemen was admitted for MDD (major depressive disorder), recurrent episode, severe ([facility]) and crisis management.  He was treated with the following medications as listed below.  Ayoub Arey was discharged with current medication and was instructed on how to take  medications as prescribed; (details listed below under Medication List).  Medical problems were identified and treated as needed.  Home medications were restarted as appropriate.  Improvement was monitored by observation and Edwyna Perfect daily report of symptom reduction.  Emotional and mental status was monitored by daily self-inventory reports completed by Edwyna Perfect and clinical staff.         Arvo Ealy was evaluated by the treatment team for stability and plans for continued recovery upon discharge.  Bassel Gaskill motivation was an integral factor for scheduling further treatment.  Employment, transportation, bed availability, health status, family support, and any pending legal issues were also considered during his hospital stay.  He was offered further treatment options upon discharge including but not limited to Residential, Intensive Outpatient, and Outpatient treatment.  Damareon Lanni will follow up with the services as listed below under Follow Up Information.     Upon completion of this admission the Angelos Wasco was both mentally and medically stable for discharge denying suicidal/homicidal ideation, auditory/visual/tactile hallucinations, delusional thoughts and paranoia.     Physical Findings: AIMS:  , ,  ,  ,    CIWA:    COWS:     Musculoskeletal: Strength & Muscle Tone: within normal limits Gait & Station: normal Patient leans: N/A  Psychiatric Specialty Exam: Review of Systems  All other systems reviewed and are negative.   Blood  pressure 108/64, pulse 58, temperature 97.8 F (36.6 C), temperature source Oral, resp. rate 18, height 5\' 10"  (1.778 m), weight 97.977 kg (216 lb), SpO2 98 %.Body mass index is 30.99 kg/(m^2).   General Appearance: Casual  Eye Contact:: Good  Speech: Clear and Coherent  Volume: Normal  Mood: Depressed  Affect: Congruent  Thought Process: Circumstantial  Orientation: Full (Time, Place, and Person)  Thought Content:  Negative  Suicidal Thoughts: Yes. without intent/plan  Homicidal Thoughts: No  Memory: Immediate; Good  Judgement: Fair  Insight: Fair  Psychomotor Activity: Normal  Concentration: Good  Recall: Good  Fund of Knowledge:Good  Language: Good  Akathisia: Negative  Handed: Right  AIMS (if indicated):    Assets: Desire for Improvement  ADL's: Intact  Cognition: WNL  Sleep:  poor        Have you used any form of tobacco in the last 30 days? (Cigarettes, Smokeless Tobacco, Cigars, and/or Pipes): Yes  Has this patient used any form of tobacco in the last 30 days? (Cigarettes, Smokeless Tobacco, Cigars, and/or Pipes) Yes, N/A  Metabolic Disorder Labs:  No results found for: HGBA1C, MPG No results found for: PROLACTIN No results found for: CHOL, TRIG, HDL, CHOLHDL, VLDL, LDLCALC  See Psychiatric Specialty Exam and Suicide Risk Assessment completed by Attending Physician prior to discharge.  Discharge destination:  Home  Is patient on multiple antipsychotic therapies at discharge:  No   Has Patient had three or more failed trials of antipsychotic monotherapy by history:  No  Recommended Plan for Multiple Antipsychotic Therapies: NA    Medication List    STOP taking these medications        cetirizine 10 MG tablet  Commonly known as:  ZYRTEC      TAKE these medications      Indication   FLUoxetine 20 MG capsule  Commonly known as:  PROZAC  Take 1 capsule (20 mg total) by mouth daily.   Indication:  Depression     traZODone 50 MG tablet  Commonly known as:  DESYREL  Take 1 tablet (50 mg total) by mouth at bedtime as needed for sleep.   Indication:  Trouble Sleeping       Follow-up Information    Follow up with [facility], [facility]. Schedule an appointment as soon as possible for a visit today.   Why:  Medication management and Counseling.   Contact information:   [address] [place] [phone] [address] [place], [place] [phone]      Follow-up recommendations:  Activity:  as tol Diet:  as tol  Comments:  1.  Take all your medications as prescribed.              2.  Report any adverse side effects to outpatient provider.                       3.  Patient instructed to not use alcohol or illegal drugs while on prescription medicines.            4.  In the event of worsening symptoms, instructed patient to call 911, the crisis hotline or go to nearest emergency room for evaluation of symptoms.  Signed: Velna HatchetSheila May Timotheus Salm AGNP-BC 03/04/2015, 3:53 PM

## 2018-02-26 ENCOUNTER — Emergency Department (HOSPITAL_COMMUNITY)
Admission: EM | Admit: 2018-02-26 | Discharge: 2018-02-26 | Disposition: A | Discharge status: Home / Self Care | Payer: Self-pay | Attending: Emergency Medicine | Admitting: Emergency Medicine

## 2018-02-26 ENCOUNTER — Other Ambulatory Visit: Payer: Self-pay

## 2018-02-26 ENCOUNTER — Encounter (HOSPITAL_COMMUNITY): Payer: Self-pay

## 2018-02-26 ENCOUNTER — Emergency Department (HOSPITAL_COMMUNITY): Payer: Self-pay

## 2018-02-26 DIAGNOSIS — R059 Cough, unspecified: Secondary | ICD-10-CM

## 2018-02-26 DIAGNOSIS — R0789 Other chest pain: Secondary | ICD-10-CM | POA: Insufficient documentation

## 2018-02-26 DIAGNOSIS — Z79899 Other long term (current) drug therapy: Secondary | ICD-10-CM | POA: Insufficient documentation

## 2018-02-26 DIAGNOSIS — R05 Cough: Secondary | ICD-10-CM | POA: Insufficient documentation

## 2018-02-26 DIAGNOSIS — F17210 Nicotine dependence, cigarettes, uncomplicated: Secondary | ICD-10-CM | POA: Insufficient documentation

## 2018-02-26 LAB — CBC
HCT: 43.9 % (ref 39.0–52.0)
Hemoglobin: 14.6 g/dL (ref 13.0–17.0)
MCH: 29.1 pg (ref 26.0–34.0)
MCHC: 33.3 g/dL (ref 30.0–36.0)
MCV: 87.6 fL (ref 80.0–100.0)
Platelets: 245 10*3/uL (ref 150–400)
RBC: 5.01 MIL/uL (ref 4.22–5.81)
RDW: 12.3 % (ref 11.5–15.5)
WBC: 8.1 10*3/uL (ref 4.0–10.5)
nRBC: 0 % (ref 0.0–0.2)

## 2018-02-26 LAB — BASIC METABOLIC PANEL
Anion gap: 14 (ref 5–15)
BUN: 13 mg/dL (ref 6–20)
CHLORIDE: 104 mmol/L (ref 98–111)
CO2: 21 mmol/L — ABNORMAL LOW (ref 22–32)
CREATININE: 1.08 mg/dL (ref 0.61–1.24)
Calcium: 9 mg/dL (ref 8.9–10.3)
GFR calc Af Amer: >60 mL/min (ref >60)
GFR calc non Af Amer: >60 mL/min (ref >60)
Glucose, Bld: 152 mg/dL — ABNORMAL HIGH (ref 70–99)
Potassium: 3.5 mmol/L (ref 3.5–5.1)
SODIUM: 139 mmol/L (ref 135–145)

## 2018-02-26 LAB — I-STAT TROPONIN, ED: Troponin i, poc: 0 ng/mL (ref 0.00–0.08)

## 2018-02-26 MED ORDER — NAPROXEN 500 MG PO TABS: 500.0000 mg | ORAL_TABLET | Freq: Two times a day (BID) | ORAL | 0 refills | Status: AC

## 2018-02-26 MED ORDER — ALBUTEROL SULFATE HFA 108 (90 BASE) MCG/ACT IN AERS
2.0000 | INHALATION_SPRAY | Freq: Once | RESPIRATORY_TRACT | Status: AC
  Administered 2018-02-26: 2 via RESPIRATORY_TRACT
  Filled 2018-02-26: qty 6.7

## 2018-02-26 MED ORDER — BENZONATATE 100 MG PO CAPS: 100.0000 mg | ORAL_CAPSULE | Freq: Three times a day (TID) | ORAL | 0 refills | Status: AC

## 2018-02-26 MED ORDER — KETOROLAC TROMETHAMINE 15 MG/ML IJ SOLN
15.0000 mg | Freq: Once | INTRAMUSCULAR | Status: AC
  Administered 2018-02-26: 15 mg via INTRAMUSCULAR
  Filled 2018-02-26: qty 1

## 2018-02-26 MED ORDER — BENZONATATE 100 MG PO CAPS
100.0000 mg | ORAL_CAPSULE | Freq: Once | ORAL | Status: AC
  Administered 2018-02-26: 100 mg via ORAL
  Filled 2018-02-26: qty 1

## 2018-02-26 NOTE — Discharge Instructions (Signed)
Please read and follow all provided instructions.  Your diagnoses today include:  1. Chest wall pain   2. Cough     Tests performed today include:  An EKG of your heart  A chest x-ray  Cardiac enzymes - a blood test for heart muscle damage  Blood counts and electrolytes  Vital signs. See below for your results today.   Medications prescribed:   Tessalon Perles - cough suppressant medication   Naproxen - anti-inflammatory pain medication  Do not exceed 500mg  naproxen every 12 hours, take with food  You have been prescribed an anti-inflammatory medication or NSAID. Take with food. Take smallest effective dose for the shortest duration needed for your pain. Stop taking if you experience stomach pain or vomiting.   Take any prescribed medications only as directed.  Follow-up instructions: Please follow-up with your primary care provider if symptoms do not improve.   Return instructions:  SEEK IMMEDIATE MEDICAL ATTENTION IF:  You have severe chest pain, especially if the pain is crushing or pressure-like and spreads to the arms, back, neck, or jaw, or if you have sweating, nausea (feeling sick to your stomach), or shortness of breath. THIS IS AN EMERGENCY. Don't wait to see if the pain will go away. Get medical help at once. Call 911 or 0 (operator). DO NOT drive yourself to the hospital.   Your chest pain gets worse and does not go away with rest.   You have an attack of chest pain lasting longer than usual, despite rest and treatment with the medications your caregiver has prescribed.   You wake from sleep with chest pain or shortness of breath.  You feel dizzy or faint.  You have chest pain not typical of your usual pain for which you originally saw your caregiver.   You have any other emergent concerns regarding your health.  Additional Information: Chest pain comes from many different causes. Your caregiver has diagnosed you as having chest pain that is not  specific for one problem, but does not require admission.  You are at low risk for an acute heart condition or other serious illness.   Your vital signs today were: BP 120/80 (BP Location: Left Arm)    Pulse 65    Temp 98.3 F (36.8 C) (Oral)    Resp 19    Ht 5\' 10"  (1.778 m)    Wt 104.3 kg    SpO2 96%    BMI 33.00 kg/m  If your blood pressure (BP) was elevated above 135/85 this visit, please have this repeated by your doctor within one month. --------------

## 2018-02-26 NOTE — ED Triage Notes (Signed)
Pt c.o right sided CP that started around 5 pm today, no other symptoms. Pain increases with deep inspiration. +prod cough. Denies fever or chills. +smoker 0.5ppd

## 2018-02-26 NOTE — ED Notes (Signed)
ED Provider at bedside. 

## 2018-02-26 NOTE — ED Provider Notes (Signed)
[facility] EMERGENCY DEPARTMENT Provider Note   CSN: [id number] Arrival date & time: 02/26/18  2025     History   Chief Complaint Chief Complaint  Patient presents with  . Chest Pain    HPI Fernando Luna is a 37 y.o. male.  Patient with history of smoking presents the emergency department with complaint of acute onset of right-sided chest pain starting about 5 PM today after coughing spell.  Patient states that he has had chest "congestion" and cough ongoing over the past 3 to 4 days.  His friend was sick prior to this time.  He has taken Mucinex without relief.  He denies any fevers but does note that he has had some wheezing.  No ear pain, sore throat, nasal congestion.  Describes the pain in his right chest as sharp in nature with radiation to the back.  It is worse with movement and deep breathing.  No shortness of breath. 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  Patient denies history of high blood pressure, high cholesterol, diabetes.  Coronary disease history in a grandparent, no first-degree relatives.      Past Medical History:  Diagnosis Date  . Allergy   . Depression     Patient Active Problem List   Diagnosis Date Noted  . MDD (major depressive disorder), recurrent episode, severe (HCC) 03/04/2015    Past Surgical History:  Procedure Laterality Date  . SPINE SURGERY     herniated disc in back L4,L5        Home Medications    Prior to Admission medications   Medication Sig Start Date End Date Taking? Authorizing Provider  benzonatate (TESSALON) 100 MG capsule Take 1 capsule (100 mg total) by mouth every 8 (eight) hours. 02/26/18   Renne Crigler, PA-C  FLUoxetine (PROZAC) 20 MG capsule  Take 1 capsule (20 mg total) by mouth daily. 03/04/15   Adonis Brook, NP  naproxen (NAPROSYN) 500 MG tablet Take 1 tablet (500 mg total) by mouth 2 (two) times daily. 02/26/18   Renne Crigler, PA-C  traZODone (DESYREL) 50 MG tablet Take 1 tablet (50 mg total) by mouth at bedtime as needed for sleep. 03/04/15   Adonis Brook, NP    Family History Family History  Problem Relation Age of Onset  . Diabetes Paternal Grandfather   . Heart disease Paternal Grandfather     Social History Social History   Tobacco Use  . Smoking status: Current Every Day Smoker    Packs/day: 0.70    Types: Cigarettes  Substance Use Topics  . Alcohol use: Yes    Alcohol/week: 2.0 standard drinks    Types: 2 Standard drinks or equivalent per week  . Drug use: No     Allergies   Codeine   Review of Systems Review of Systems  Constitutional: Negative for diaphoresis and fever.  Eyes: Negative for redness.  Respiratory: Positive for cough and wheezing. Negative for shortness of breath.   Cardiovascular: Positive for chest pain. Negative for palpitations and leg swelling.  Gastrointestinal: Negative for abdominal pain, nausea and vomiting.  Genitourinary: Negative for dysuria.  Musculoskeletal: Negative for back pain and neck pain.  Skin: Negative for rash.  Neurological: Negative for syncope and light-headedness.  Psychiatric/Behavioral: The patient is not nervous/anxious.  Physical Exam Updated Vital Signs BP 120/80 (BP Location: Left Arm)   Pulse 65   Temp 98.3 F (36.8 C) (Oral)   Resp 19   Ht 5\' 10"  (1.778 m)   Wt 104.3 kg   SpO2 96%   BMI 33.00 kg/m   Physical Exam  Constitutional: He appears well-developed and well-nourished.  HENT:  Head: Normocephalic and atraumatic.  Mouth/Throat: Mucous membranes are normal. Mucous membranes are not dry.  Eyes: Conjunctivae are normal.  Neck: Trachea normal and normal range of motion. Neck supple. Normal carotid pulses and no JVD  present. No muscular tenderness present. Carotid bruit is not present. No tracheal deviation present.  Cardiovascular: Normal rate, regular rhythm, S1 normal, S2 normal, normal heart sounds and intact distal pulses. Exam reveals no distant heart sounds and no decreased pulses.  No murmur heard. Pulmonary/Chest: Effort normal and breath sounds normal. No respiratory distress. He has no wheezes. He exhibits tenderness.  Patient with readily reproducible tenderness with palpation over the anterior right chest wall.  No step-offs or deformities.  Patient winces when I push on the area below his right nipple.  He also winces when he sits forward in the bed for lung exam.  Abdominal: Soft. Normal aorta and bowel sounds are normal. There is no tenderness. There is no rebound and no guarding.  Musculoskeletal: He exhibits no edema.  Neurological: He is alert.  Skin: Skin is warm and dry. He is not diaphoretic. No cyanosis. No pallor.  Psychiatric: He has a normal mood and affect.  Nursing note and vitals reviewed.    ED Treatments / Results  Labs (all labs ordered are listed, but only abnormal results are displayed) Labs Reviewed  BASIC METABOLIC PANEL - Abnormal; Notable for the following components:      Result Value   CO2 21 (*)    Glucose, Bld 152 (*)    All other components within normal limits  CBC  I-STAT TROPONIN, ED   ED ECG REPORT   Date: 02/26/2018  Rate: 75  Rhythm: normal sinus rhythm  QRS Axis: normal  Intervals: normal  ST/T Wave abnormalities: normal  Conduction Disutrbances:none  Narrative Interpretation:   Old EKG Reviewed: none available  I have personally reviewed the EKG tracing and agree with the computerized printout as noted.   Radiology Dg Chest 2 View  Result Date: 02/26/2018 CLINICAL DATA:  Right-sided chest pain EXAM: CHEST - 2 VIEW COMPARISON:  None. FINDINGS: Normal heart size and mediastinal contours. No acute infiltrate or edema. No effusion or  pneumothorax. No acute osseous findings. IMPRESSION: Negative chest. Electronically Signed   By: Marnee Spring M.D.   On: 02/26/2018 21:01    Procedures Procedures (including critical care time)  Medications Ordered in ED Medications  ketorolac (TORADOL) 15 MG/ML injection 15 mg (has no administration in time range)  albuterol (PROVENTIL HFA;VENTOLIN HFA) 108 (90 Base) MCG/ACT inhaler 2 puff (has no administration in time range)  benzonatate (TESSALON) capsule 100 mg (has no administration in time range)     Initial Impression / Assessment and Plan / ED Course  I have reviewed the triage vital signs and the nursing notes.  Pertinent labs & imaging results that were available during my care of the patient were reviewed by me and considered in my medical decision making (see chart for details).     Patient seen and examined.  Patient with negative cardiac work-up here.  EKG reviewed by myself.  He gives story suggestive of musculoskeletal pain.  Chest x-ray without signs of pneumonia or pneumothorax.  He does not have any risk factors for PE and is PERC negative.  We will treat patient here with dose of IM Toradol and discharged home with inhaler and Tessalon for cough.  Suspect that he has underlying bronchitis.  Vital signs reviewed and are as follows: BP 120/80 (BP Location: Left Arm) Comment: Simultaneous filing. User may not have seen previous data.  Pulse 68 Comment: Simultaneous filing. User may not have seen previous data.  Temp 98.3 F (36.8 C) (Oral)   Resp 19 Comment: Simultaneous filing. User may not have seen previous data.  Ht 5\' 10"  (1.778 m)   Wt 104.3 kg   SpO2 95% Comment: Simultaneous filing. User may not have seen previous data.  BMI 33.00 kg/m   Encouraged return to the emergency department with worsening pain, shortness of breath especially if the symptoms are different in nature.  Patient verbalizes understanding and agrees with plan.    Final Clinical  Impressions(s) / ED Diagnoses   Final diagnoses:  Chest wall pain  Cough   Patient with bronchitis type symptoms over the past several days with acute onset of chest pain after forceful coughing spell.  No pneumothorax or pneumonia on chest x-ray.  Troponin negative x1.  EKG without any ischemic findings.  History and reproducible symptoms on exam are highly suspicious for chest wall/musculoskeletal pain.  Will treat accordingly.  Return instructions as above.  Patient in agreement with plan.  ED Discharge Orders         Ordered    benzonatate (TESSALON) 100 MG capsule  Every 8 hours     02/26/18 2256    naproxen (NAPROSYN) 500 MG tablet  2 times daily     02/26/18 2256           Renne CriglerGeiple, Stehanie Ekstrom, PA-C 02/26/18 2303    Vanetta MuldersZackowski, Scott, MD 02/27/18 0000

## 2019-05-22 IMAGING — CR DG CHEST 2V
2 series · 2 of 2 positions shown · non-contrast
Comparison: None.

CLINICAL DATA: Right-sided chest pain

EXAM:
CHEST - 2 VIEW

[chest pa]
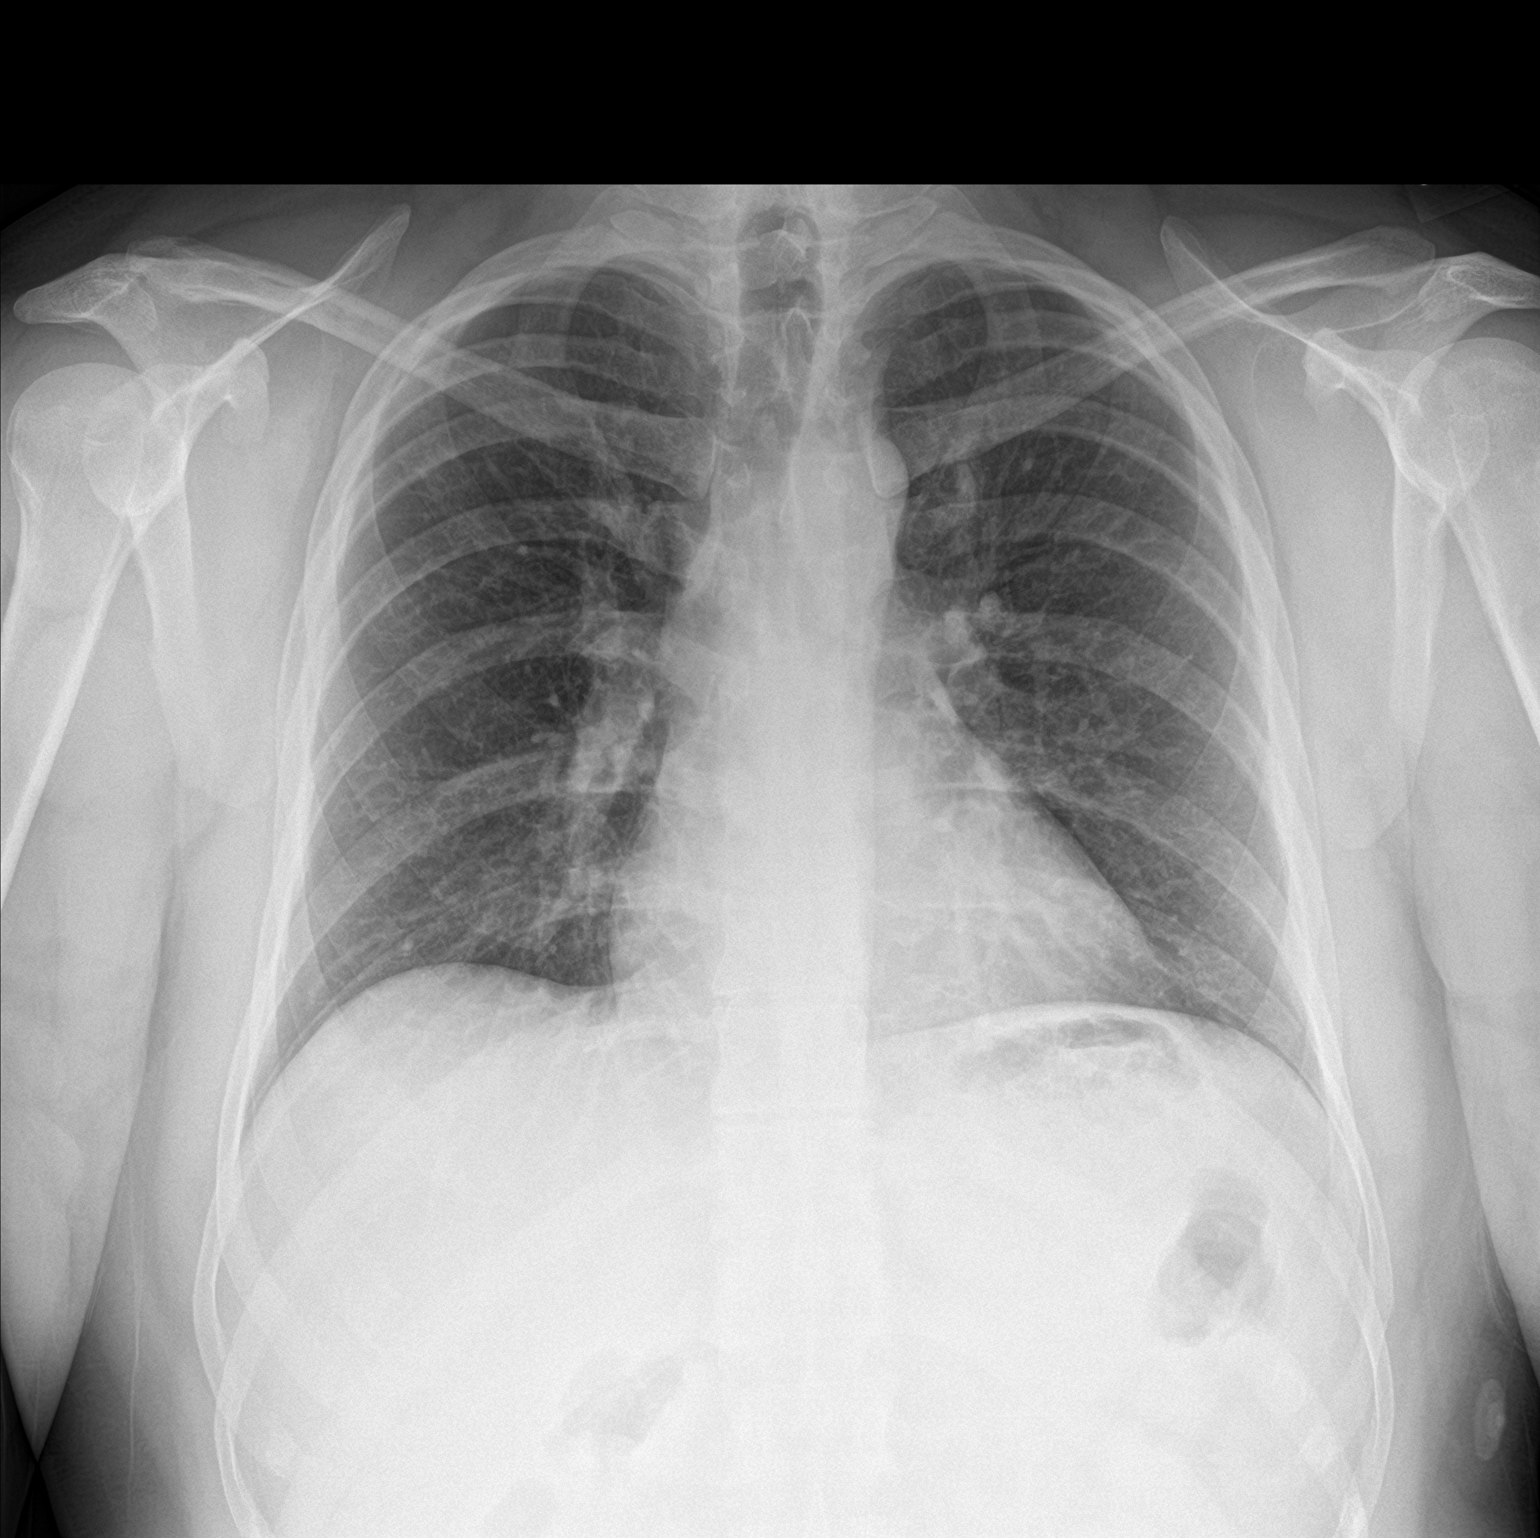

[chest lat]
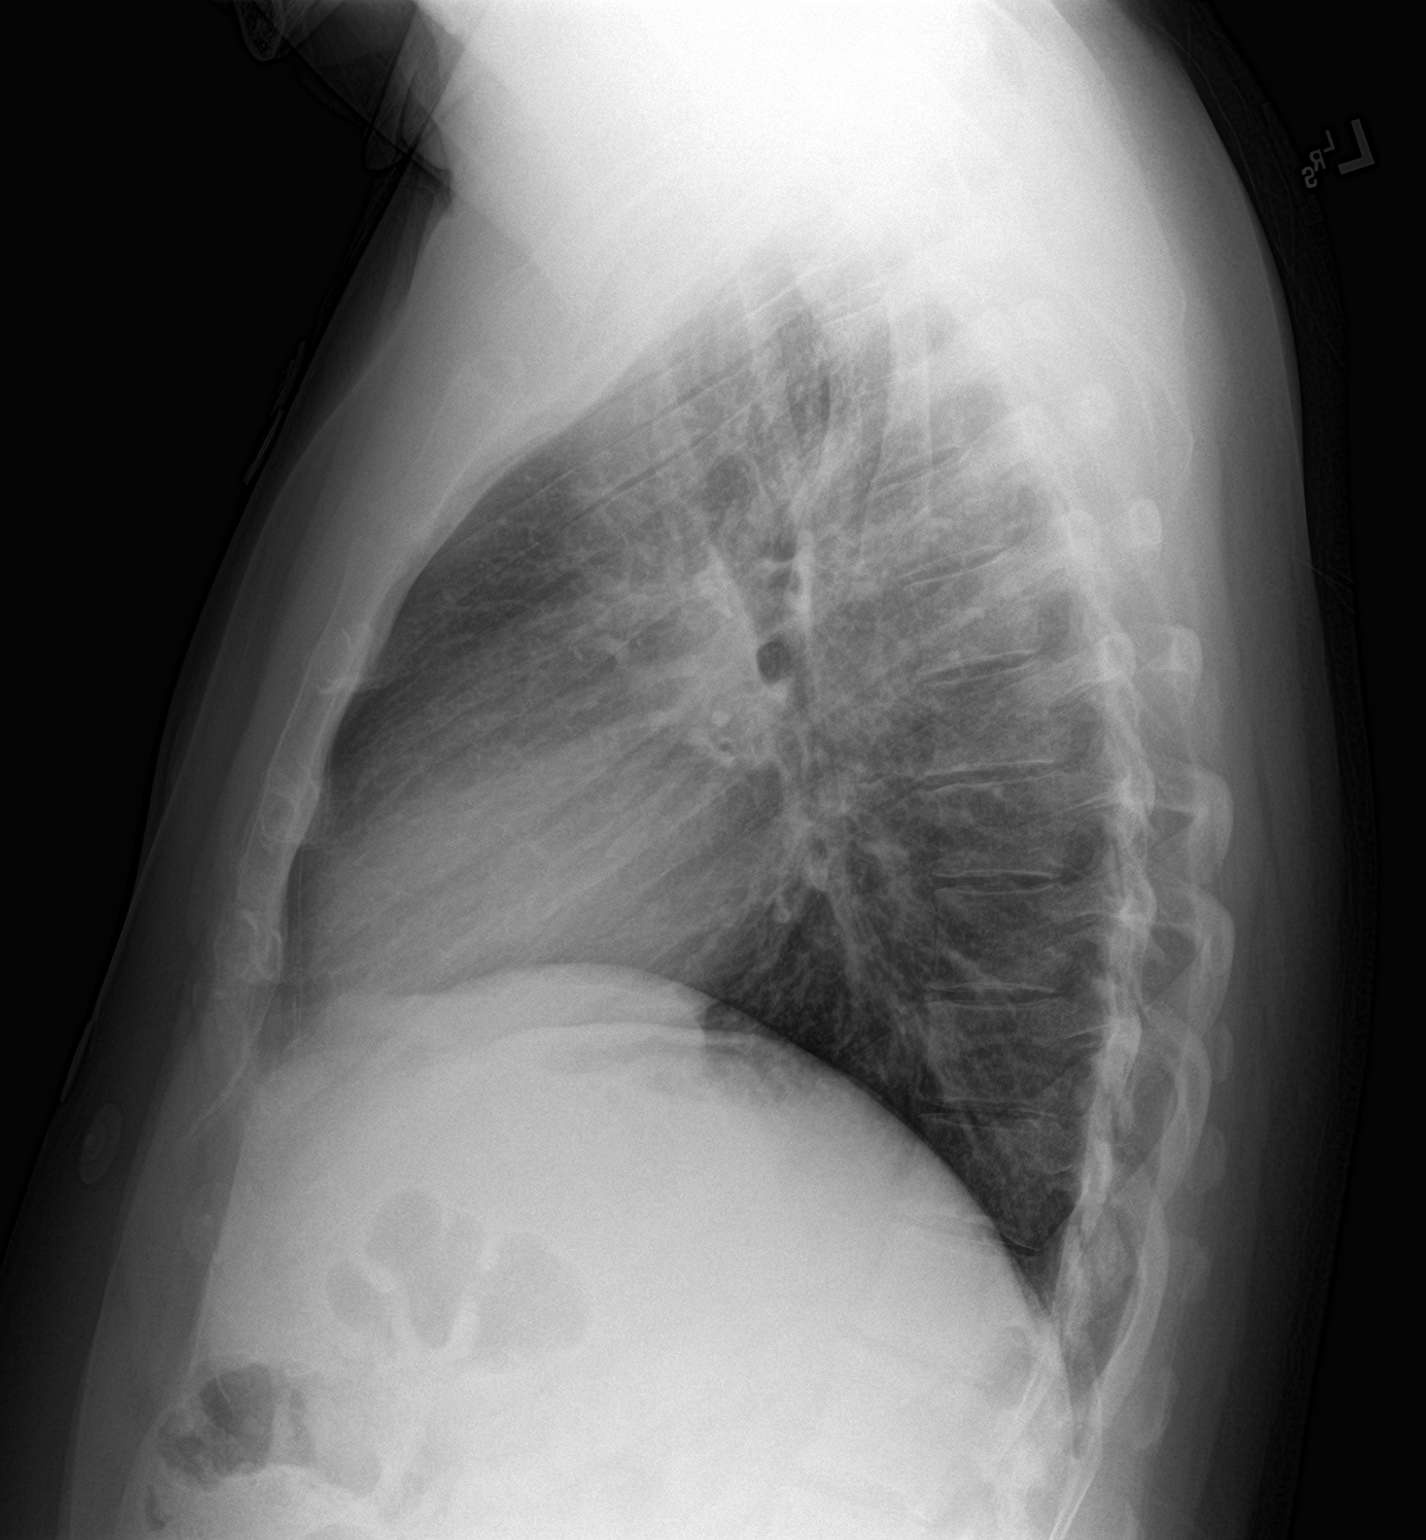

[2 of 2 positions shown; findings below may reference images not displayed]

FINDINGS: Normal heart size and mediastinal contours. No acute infiltrate or
edema. No effusion or pneumothorax. No acute osseous findings.
IMPRESSION: Negative chest.
